# Patient Record
Sex: Female | Born: 2000 | Race: White | Hispanic: No | Marital: Single | Smoking: Never smoker
Health system: Southern US, Community
[De-identification: ages and names within clinical notes are randomized; demographics above are authoritative.]

## PROBLEM LIST (undated history)

## (undated) DIAGNOSIS — M48061 Spinal stenosis, lumbar region without neurogenic claudication: Secondary | ICD-10-CM

## (undated) DIAGNOSIS — F419 Anxiety disorder, unspecified: Secondary | ICD-10-CM

## (undated) HISTORY — PX: WISDOM TOOTH EXTRACTION: SHX21

## (undated) HISTORY — DX: Anxiety disorder, unspecified: F41.9

## (undated) HISTORY — DX: Spinal stenosis, lumbar region without neurogenic claudication: M48.061

## (undated) HISTORY — PX: TONSILLECTOMY: SUR1361

---
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
## 2018-06-22 ENCOUNTER — Ambulatory Visit (INDEPENDENT_AMBULATORY_CARE_PROVIDER_SITE_OTHER): Payer: 59 | Admitting: Family Medicine

## 2018-06-22 ENCOUNTER — Encounter: Payer: Self-pay | Admitting: Family Medicine

## 2018-06-22 VITALS — BP 118/80 | HR 65 | Temp 98.0°F | Ht 64.0 in | Wt 173.2 lb

## 2018-06-22 DIAGNOSIS — L739 Follicular disorder, unspecified: Secondary | ICD-10-CM

## 2018-06-22 DIAGNOSIS — F419 Anxiety disorder, unspecified: Secondary | ICD-10-CM | POA: Diagnosis not present

## 2018-06-22 DIAGNOSIS — Z0000 Encounter for general adult medical examination without abnormal findings: Secondary | ICD-10-CM | POA: Diagnosis not present

## 2018-06-22 MED ORDER — BUSPIRONE HCL 7.5 MG PO TABS: 7.5000 mg | ORAL_TABLET | Freq: Three times a day (TID) | ORAL | 1 refills | Status: DC

## 2018-06-22 MED ORDER — HYDROXYZINE HCL 10 MG PO TABS: ORAL_TABLET | ORAL | 1 refills | Status: DC

## 2018-06-22 MED ORDER — MUPIROCIN 2 % EX OINT: TOPICAL_OINTMENT | CUTANEOUS | 3 refills | Status: DC

## 2018-06-22 NOTE — Patient Instructions (Signed)
We are going to start you on hydroxyzine AS NEEDED for panic attacks/increased anxiety. You can take 1-2 pills as needed up to three times a day.   If this doesn't seem to work you can start the buspar. I sent in 7.5mg  and can take this THREE times/day. Can play with dose. Max dosage is 30mg /day.   Send me an email in one month.

## 2018-06-22 NOTE — Progress Notes (Signed)
Patient: Brittany Jacobs MRN: [id number] DOB: 2001-03-22 PCP: Orland MustardWolfe, Tyrina Hines, MD     Subjective:  Chief Complaint  Patient presents with  . Establish Care  . Anxiety  . rash on legs    HPI: The patient is a 17 y.o. female who presents today for establishing care. 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  She is going into her sophomore year. She has not been in the hospital over the last year and no change to medical history. She is not sexually active. Periods are normal. She is up to date on her shot record. Unsure about men b.  Rash on legs: She has had a rash on both of her legs that as been on and off for a while. She mainly has after she shaves. It does not itch her and just is more cosmetic. Just looks bad. She has mainly on her knees and sometimes in her inner legs. She does not get in her axillary area and sometimes gets in her groin. She uses a [employer] brand shaving cream and razor. She also uses a loofah. She has not put anything on it over the counter. It has stayed the same.   Anxiety: She states she has likely always had anxiety, but over the summer it has gotten progressively worse. Nothing happened this summer that triggered it. There is a family history of this. She has no triggers except thunderstorms. She does have panic attacks and is getting every other day. She gets chest pain and short of breath. They can last hours at a time. She is able to keep up with her ADLs and school. She pushes through it and just feels horrible. She has done counseling in the past and hated it. She has never been on medications. She isn't on regular exercise program and does not practice yoga.   Review of Systems  Constitutional: Negative for chills, fatigue and fever.  HENT: Negative for dental problem, ear pain, hearing loss and trouble swallowing.   Eyes: Negative for visual disturbance.  Respiratory: Negative for cough, chest tightness and shortness of breath.   Cardiovascular:  Negative for chest pain, palpitations and leg swelling.  Gastrointestinal: Negative for abdominal pain, blood in stool, diarrhea and nausea.  Endocrine: Negative for cold intolerance, polydipsia, polyphagia and polyuria.  Genitourinary: Negative for dysuria and hematuria.  Musculoskeletal: Negative for arthralgias.  Skin: Positive for rash.  Neurological: Negative for dizziness and headaches.  Psychiatric/Behavioral: Negative for dysphoric mood and sleep disturbance. The patient is nervous/anxious.     Allergies Patient has No Known Allergies.  Past Medical History Patient  has a past medical history of Anxiety and Lumbar spinal stenosis.  Surgical History Patient  has a past surgical history that includes Wisdom tooth extraction.  Family History Pateint's family history includes Alcohol abuse in her paternal grandfather; Cancer in her maternal grandmother; Heart attack in her maternal grandfather; Hyperlipidemia in her mother; Hypertension in her father, paternal grandfather, and paternal grandmother; Kidney disease in her maternal grandmother.  Social History Patient  reports that she has never smoked. She has never used smokeless tobacco. She reports that she does not drink alcohol or use drugs.    Objective: Vitals:   06/22/18 1002  BP: 118/80  Pulse: 65  Temp: 98 F (36.7 C)  TempSrc: Oral  SpO2: 99%  Weight: 173 lb 3.2 oz (78.6 kg)  Height: 5\' 4"  (1.626 m)    Body mass index is 29.73 kg/m.  Physical Exam  Constitutional: She is oriented to person, place, and time. She appears well-developed and well-nourished.  HENT:  Right Ear: External ear normal.  Left Ear: External ear normal.  Mouth/Throat: Oropharynx is clear and moist.  Eyes: Pupils are equal, round, and reactive to light. Conjunctivae and EOM are normal.  Neck: Normal range of motion. Neck supple. No thyromegaly present.  Cardiovascular: Normal rate, regular rhythm, normal heart sounds and intact distal  pulses.  No murmur heard. Pulmonary/Chest: Effort normal and breath sounds normal.  Abdominal: Soft. Bowel sounds are normal. She exhibits no distension. There is no tenderness.  Lymphadenopathy:    She has no cervical adenopathy.  Neurological: She is alert and oriented to person, place, and time. She displays normal reflexes. No cranial nerve deficit. Coordination normal.  Skin: Skin is warm and dry. Rash (erythematous papules over knees and just inferior to knees) noted.  Psychiatric: She has a normal mood and affect. Her behavior is normal.  No si/hi/ah/vh   Vitals reviewed.      GAD 7 : Generalized Anxiety Score 06/22/2018  Nervous, Anxious, on Edge 2  Control/stop worrying 3  Worry too much - different things 2  Trouble relaxing 3  Restless 1  Easily annoyed or irritable 2  Afraid - awful might happen 2  Total GAD 7 Score 15  Anxiety Difficulty Somewhat difficult      Office Visit from 06/22/2018 in [facility]  PHQ-9 Total Score  5      Assessment/plan: 1. Annual physical exam They are not going to do labs at this time since they are self pay for labs. Do want her thyroid checked yearly with her mom's history. Continue abstinence. No need for std screening. Exercise, sunscreen, good diet. Will look into men B vaccine.  Patient counseling [x]    Nutrition: Stressed importance of moderation in sodium/caffeine intake, saturated fat and cholesterol, caloric balance, sufficient intake of fresh fruits, vegetables, fiber, calcium, iron, and 1 mg of folate supplement per day (for females capable of pregnancy).  [x]    Stressed the importance of regular exercise.   [x]    Substance Abuse: Discussed cessation/primary prevention of tobacco, alcohol, or other drug use; driving or other dangerous activities under the influence; availability of treatment for abuse.   [x]    Injury prevention: Discussed safety belts, safety helmets, smoke detector, smoking near bedding  or upholstery.   [x]    Sexuality: Discussed sexually transmitted diseases, partner selection, use of condoms, avoidance of unintended pregnancy  and contraceptive alternatives.  [x]    Dental health: Discussed importance of regular tooth brushing, flossing, and dental visits.  [x]    Health maintenance and immunizations reviewed. Please refer to Health maintenance section.     2. Anxiety GAD7 score with mild-moderate anxiety. Affecting her daily. We are going to start her on an as needed medication for panic attacks/increased anxiety. Will do hydroxyzine prn. Drowsy precautions given. Since she leaves for school in a few days, also sending in buspar if needed. Discussed how to take this medication and side effects. Does not want to do counseling, so I really want her to start a regular exercise program and look into yoga. Very supportive family. She is going to email me in a month and f/u with me when she is home from school. Any issues, let me know.   3. Folliculitis Secondary to shaving. Recommended to get a different razor and try not to shave so close to skin. Would not use a loofah. Did send in  bactroban ointment to try out and discussed other options with limited research behind them.     Return in about 4 months (around 10/22/2018) for anxiety when home from school .   Orland MustardAllison Othel Dicostanzo, MD [facility] [facility]   06/22/2018 rah

## 2018-06-22 NOTE — Progress Notes (Deleted)
Patient: Brittany Jacobs MRN: [id number] DOB: 01/11/2001 PCP: Orland MustardWolfe, Allison, MD     Subjective:  No chief complaint on file.   HPI: The patient is a 17 y.o. female who presents today for annual exam. {He/she (caps):30048} denies any changes to past medical history. There have been no recent hospitalizations. They {Actions; are/are not:16769} following a well balanced diet and exercise plan. Weight has been {trend:16658}. No complaints today.    There is no immunization history on file for this patient. Colonoscopy: Mammogram:  Pap smear:  PSA:   Review of Systems  Allergies Patient has No Known Allergies.  Past Medical History Patient  has no past medical history on file.  Surgical History Patient  has no past surgical history on file.  Family History Pateint's family history is not on file.  Social History Patient  reports that she has never smoked. She does not have any smokeless tobacco history on file. She reports that she does not drink alcohol or use drugs.    Objective: There were no vitals filed for this visit.  There is no height or weight on file to calculate BMI.  Physical Exam     Assessment/plan:   No problem-specific Assessment & Plan notes found for this encounter.    No follow-ups on file.     Orland MustardAllison Wolfe, MD [place] [facility]  06/22/2018

## 2018-07-14 ENCOUNTER — Other Ambulatory Visit: Payer: Self-pay | Admitting: Family Medicine

## 2018-08-10 ENCOUNTER — Other Ambulatory Visit: Payer: Self-pay | Admitting: Family Medicine

## 2018-09-14 ENCOUNTER — Other Ambulatory Visit: Payer: Self-pay | Admitting: Family Medicine

## 2020-04-18 ENCOUNTER — Encounter: Payer: Self-pay | Admitting: Family Medicine

## 2020-04-18 ENCOUNTER — Ambulatory Visit (INDEPENDENT_AMBULATORY_CARE_PROVIDER_SITE_OTHER): Payer: 59 | Admitting: Family Medicine

## 2020-04-18 ENCOUNTER — Other Ambulatory Visit: Payer: Self-pay

## 2020-04-18 VITALS — BP 90/70 | HR 66 | Temp 97.2°F | Ht 64.0 in | Wt 144.0 lb

## 2020-04-18 DIAGNOSIS — F419 Anxiety disorder, unspecified: Secondary | ICD-10-CM | POA: Diagnosis not present

## 2020-04-18 DIAGNOSIS — F329 Major depressive disorder, single episode, unspecified: Secondary | ICD-10-CM

## 2020-04-18 MED ORDER — FLUOXETINE HCL 20 MG PO CAPS: 20.0000 mg | ORAL_CAPSULE | Freq: Every day | ORAL | 1 refills | Status: DC

## 2020-04-18 MED ORDER — HYDROXYZINE HCL 25 MG PO TABS: 25.0000 mg | ORAL_TABLET | Freq: Three times a day (TID) | ORAL | 0 refills | Status: DC | PRN

## 2020-04-18 NOTE — Patient Instructions (Signed)
1) starting prozac 20mg /day. If after 3 weeks you like but need more can increase to 40mg . Just take 2 pills.  2) hydroxzyine increased to 25mg . Take for panic attacks or if feeling like anxiety is spiraling out of control.  3) keep counseling 4) exercise.  5) yoga is also helpful.  6) dont watch the news.  7) seeing you a month.

## 2020-04-18 NOTE — Progress Notes (Signed)
Patient: Brittany Jacobs MRN: [id number] DOB: February 12, 2001 PCP: Orma Flaming, MD     Subjective:  Chief Complaint  Patient presents with  . Anxiety    HPI: The patient is a 19 y.o. female who presents today for Anxiety. She saw me in 2019 and we sent in hydroxyzine and buspar; however, the buspar was too expensive so she didn't pick this up. She has been taking hydrozyine prn and this is working well for her. She states her anxiety though is much worse and she knows she needs a daily medication. She did start counseling. She has knows she is very hard on herself and has a lot of self pressure. She was in online school the entire past year. She is an extravert and this likely also contributed. She is getting paralyzing panic attacks.   She has struggled with depression her freshman year of high school, but she did not go on medication. She may be slightly depressed. She feels like it's tied to her anxiety because she beats herself down because her anxiety has gotten so bad. She shames herself for having anxiety.   Review of Systems  Respiratory: Negative for cough, shortness of breath and wheezing.   Cardiovascular: Negative for chest pain and palpitations.  Gastrointestinal: Negative for abdominal pain, nausea and vomiting.  Neurological: Negative for dizziness, light-headedness and headaches.    Allergies Patient has No Known Allergies.  Past Medical History Patient  has a past medical history of Anxiety and Lumbar spinal stenosis.  Surgical History Patient  has a past surgical history that includes Wisdom tooth extraction.  Family History Pateint's family history includes Alcohol abuse in her paternal grandfather; Cancer in her maternal grandmother; Heart attack in her maternal grandfather; Hyperlipidemia in her mother; Hypertension in her father, paternal grandfather, and paternal grandmother; Kidney disease in her maternal grandmother.  Social History Patient  reports that she  has never smoked. She has never used smokeless tobacco. She reports that she does not drink alcohol and does not use drugs.    Objective: Vitals:   04/18/20 1406  BP: 90/70  Pulse: 66  Temp: (!) 97.2 F (36.2 C)  TempSrc: Temporal  SpO2: 97%  Weight: 144 lb (65.3 kg)  Height: 5\' 4"  (1.626 m)    Body mass index is 24.72 kg/m.  Physical Exam Vitals reviewed.  Constitutional:      Appearance: Normal appearance. She is normal weight.  Cardiovascular:     Rate and Rhythm: Normal rate and regular rhythm.     Heart sounds: Normal heart sounds.  Pulmonary:     Effort: Pulmonary effort is normal.     Breath sounds: Normal breath sounds.  Abdominal:     General: Abdomen is flat. Bowel sounds are normal.     Palpations: Abdomen is soft.  Neurological:     General: No focal deficit present.     Mental Status: She is alert and oriented to person, place, and time.  Psychiatric:        Mood and Affect: Mood normal.        Behavior: Behavior normal.     Comments: No si/hi/ah/vh         GAD 7 : Generalized Anxiety Score 04/18/2020 06/22/2018  Nervous, Anxious, on Edge 3 2  Control/stop worrying 3 3  Worry too much - different things 3 2  Trouble relaxing 1 3  Restless - 1  Easily annoyed or irritable 2 2  Afraid - awful might happen 3 2  Total  GAD 7 Score - 15  Anxiety Difficulty Very difficult Somewhat difficult     Office Visit from 04/18/2020 in [facility]  PHQ-9 Total Score 13       Assessment/plan: 1. Anxiety and depression GAD7 severe and phq9 is mild. She does need a daily medication. Starting her on prozac 20mg /day to help with both her severe anxiety and depression and hopefully help decrease her panic attacks. Continue with hydroxyzine prn and increasing to 25mg  prn TID. Continue counseling and exercise. I've explained to her that drugs of the SSRI class can have side effects such as weight gain, sexual dysfunction, insomnia, headache,  nausea. These medications are generally effective at alleviating symptoms of anxiety and/or depression. Let me know if significant side effects do occur. Any si/hi she is to call 911 or go to ER. Close f/u in 1 month.   This visit occurred during the SARS-CoV-2 public health emergency.  Safety protocols were in place, including screening questions prior to the visit, additional usage of staff PPE, and extensive cleaning of exam room while observing appropriate contact time as indicated for disinfecting solutions.     Return in about 1 month (around 05/18/2020) for anxiety and depression .   , MD [place]   04/18/2020

## 2020-05-19 ENCOUNTER — Ambulatory Visit: Payer: 59 | Admitting: Family Medicine

## 2020-06-11 ENCOUNTER — Other Ambulatory Visit: Payer: Self-pay

## 2020-06-11 ENCOUNTER — Ambulatory Visit (INDEPENDENT_AMBULATORY_CARE_PROVIDER_SITE_OTHER): Payer: 59 | Admitting: Family Medicine

## 2020-06-11 ENCOUNTER — Encounter: Payer: Self-pay | Admitting: Family Medicine

## 2020-06-11 VITALS — BP 113/77 | HR 71 | Temp 97.5°F | Ht 64.0 in | Wt 140.2 lb

## 2020-06-11 DIAGNOSIS — F329 Major depressive disorder, single episode, unspecified: Secondary | ICD-10-CM | POA: Diagnosis not present

## 2020-06-11 DIAGNOSIS — Z3009 Encounter for other general counseling and advice on contraception: Secondary | ICD-10-CM | POA: Diagnosis not present

## 2020-06-11 DIAGNOSIS — F419 Anxiety disorder, unspecified: Secondary | ICD-10-CM

## 2020-06-11 MED ORDER — FLUOXETINE HCL 20 MG PO CAPS: 20.0000 mg | ORAL_CAPSULE | Freq: Every day | ORAL | 1 refills | Status: DC

## 2020-06-11 MED ORDER — HYDROXYZINE HCL 25 MG PO TABS: 25.0000 mg | ORAL_TABLET | Freq: Three times a day (TID) | ORAL | 1 refills | Status: AC | PRN

## 2020-06-11 NOTE — Patient Instructions (Signed)
-  doing great! Refilled prozac for 90 days with refill and also your hydroxyzine.   -good luck with school! Let me know if you need anything,   Dr. Artis Flock

## 2020-06-11 NOTE — Progress Notes (Signed)
Patient: Brittany Jacobs MRN: [id number] DOB: 24-Oct-2001 PCP: Orland Mustard, MD     Subjective:  Chief Complaint  Patient presents with  . Anxiety  . Depression    HPI: The patient is a 19 y.o. female who presents today for Anxiety/Depression. She complains of what she describes as "funky" dreams due to taking Prozac. She says that they have subsided, but did have one last week. She states the dreams were violent. After the first week they have pretty much subsided. She is doing well and symptoms of her anxiety and depression have drastically improved. Only side effect is waking up at 2am. She goes to bed at midnight and is not practicing great sleep hygiene. She has only had to take hydroxyzine a few times. No panic attacks. Has a new boyfriend.   She also would like to discuss Depo Provera birth control. She just would like to discuss this.   Review of Systems  Cardiovascular: Negative for chest pain and palpitations.  Gastrointestinal: Negative for abdominal pain, nausea and vomiting.  Genitourinary: Negative for frequency, pelvic pain and urgency.  Neurological: Negative for dizziness and headaches.    Allergies Patient has No Known Allergies.  Past Medical History Patient  has a past medical history of Anxiety and Lumbar spinal stenosis.  Surgical History Patient  has a past surgical history that includes Wisdom tooth extraction.  Family History Pateint's family history includes Alcohol abuse in her paternal grandfather; Cancer in her maternal grandmother; Heart attack in her maternal grandfather; Hyperlipidemia in her mother; Hypertension in her father, paternal grandfather, and paternal grandmother; Kidney disease in her maternal grandmother.  Social History Patient  reports that she has never smoked. She has never used smokeless tobacco. She reports that she does not drink alcohol and does not use drugs.    Objective: Vitals:   06/11/20 1430  BP: 113/77  Pulse: 71   Temp: (!) 97.5 F (36.4 C)  TempSrc: Temporal  SpO2: 100%  Weight: 140 lb 3.2 oz (63.6 kg)  Height: 5\' 4"  (1.626 m)    Body mass index is 24.07 kg/m.  Physical Exam Vitals reviewed.  Constitutional:      Appearance: Normal appearance. She is well-developed and normal weight.  HENT:     Head: Normocephalic and atraumatic.  Neck:     Thyroid: No thyromegaly.  Cardiovascular:     Rate and Rhythm: Normal rate and regular rhythm.     Heart sounds: Normal heart sounds. No murmur heard.   Pulmonary:     Effort: Pulmonary effort is normal.     Breath sounds: Normal breath sounds.  Abdominal:     General: Bowel sounds are normal. There is no distension.     Palpations: Abdomen is soft.     Tenderness: There is no abdominal tenderness.  Musculoskeletal:     Cervical back: Normal range of motion and neck supple.  Lymphadenopathy:     Cervical: No cervical adenopathy.  Skin:    General: Skin is warm and dry.     Capillary Refill: Capillary refill takes less than 2 seconds.     Findings: No rash.  Neurological:     General: No focal deficit present.     Mental Status: She is alert and oriented to person, place, and time.     Cranial Nerves: No cranial nerve deficit.     Coordination: Coordination normal.     Deep Tendon Reflexes: Reflexes normal.  Psychiatric:        Mood and  Affect: Mood normal.        Behavior: Behavior normal.        GAD 7 : Generalized Anxiety Score 06/11/2020 04/18/2020 06/22/2018  Nervous, Anxious, on Edge 0 3 2  Control/stop worrying 0 3 3  Worry too much - different things 1 3 2   Trouble relaxing 1 1 3   Restless 0 - 1  Easily annoyed or irritable 0 2 2  Afraid - awful might happen 0 3 2  Total GAD 7 Score 2 - 15  Anxiety Difficulty Not difficult at all Very difficult Somewhat difficult      Office Visit from 06/11/2020 in [facility]  PHQ-9 Total Score 3      Assessment/plan: 1. Anxiety and depression gad7 and phq9  scores significantly improved and she is doing so well on them. symptoms controlled and no longer having panic attacks. Continue current prozac/hydrozyzine prn. Discussed sleep hygiene and taking melatonin. F/u in 6-12 months. (will be at school so may not be able to come in).   2. Birth control counseling Discussed depo vs. iud vs. Ocp. I do think her acne would get worse on the depo and talked about weight gain. She will continue with ocp. Recommended continued safe sex regardless.     This visit occurred during the SARS-CoV-2 public health emergency.  Safety protocols were in place, including screening questions prior to the visit, additional usage of staff PPE, and extensive cleaning of exam room while observing appropriate contact time as indicated for disinfecting solutions.    No follow-ups on file.    [facility], MD [facility]   06/11/2020

## 2020-07-23 ENCOUNTER — Encounter: Payer: Self-pay | Admitting: Family Medicine

## 2020-07-24 MED ORDER — FLUOXETINE HCL 40 MG PO CAPS: 40.0000 mg | ORAL_CAPSULE | Freq: Every day | ORAL | 1 refills | Status: DC

## 2020-07-27 ENCOUNTER — Encounter: Payer: Self-pay | Admitting: Family Medicine

## 2020-10-24 ENCOUNTER — Encounter: Payer: Self-pay | Admitting: Family Medicine

## 2021-03-26 ENCOUNTER — Other Ambulatory Visit: Payer: Self-pay | Admitting: Family Medicine

## 2021-08-06 ENCOUNTER — Other Ambulatory Visit: Payer: Self-pay | Admitting: Family Medicine

## 2021-11-13 ENCOUNTER — Other Ambulatory Visit: Payer: Self-pay | Admitting: Family Medicine

## 2021-11-18 ENCOUNTER — Other Ambulatory Visit: Payer: Self-pay | Admitting: Family Medicine

## 2021-12-29 DIAGNOSIS — F32A Depression, unspecified: Secondary | ICD-10-CM | POA: Insufficient documentation

## 2021-12-29 DIAGNOSIS — Z8342 Family history of familial hypercholesterolemia: Secondary | ICD-10-CM | POA: Insufficient documentation

## 2021-12-29 DIAGNOSIS — F419 Anxiety disorder, unspecified: Secondary | ICD-10-CM | POA: Insufficient documentation

## 2022-02-02 DIAGNOSIS — L720 Epidermal cyst: Secondary | ICD-10-CM | POA: Insufficient documentation

## 2022-09-21 ENCOUNTER — Ambulatory Visit (INDEPENDENT_AMBULATORY_CARE_PROVIDER_SITE_OTHER): Payer: No Typology Code available for payment source | Admitting: Medical

## 2022-09-21 ENCOUNTER — Encounter (HOSPITAL_BASED_OUTPATIENT_CLINIC_OR_DEPARTMENT_OTHER): Payer: Self-pay | Admitting: Medical

## 2022-09-21 VITALS — BP 118/79 | HR 64 | Ht 64.0 in | Wt 197.0 lb

## 2022-09-21 DIAGNOSIS — L739 Follicular disorder, unspecified: Secondary | ICD-10-CM

## 2022-09-21 DIAGNOSIS — R102 Pelvic and perineal pain: Secondary | ICD-10-CM

## 2022-09-21 DIAGNOSIS — Z7689 Persons encountering health services in other specified circumstances: Secondary | ICD-10-CM

## 2022-09-21 NOTE — Progress Notes (Signed)
   History:  Ms. Brittany Jacobs is a 21 y.o. who presents to clinic today for labial lesion. Patient states that this has been recurrent for many months, since prior to her most recent annual exam in [place]. Pap smear 01/2022 was normal. She was put on antibiotics for possible folliculitis and it resolved, but has recurrent on multiple occasions. It is not currently inflamed or painful. She denies fever. She is not currently sexually active.    The following portions of the patient's history were reviewed and updated as appropriate: allergies, current medications, family history, past medical history, social history, past surgical history and problem list.  Review of Systems:  Review of Systems  Constitutional:  Negative for fever and malaise/fatigue.  Gastrointestinal:  Negative for abdominal pain, constipation, diarrhea, nausea and vomiting.  Genitourinary:  Negative for dysuria, frequency and urgency.       Neg - vaginal bleeding, discharge, pelvic pain     Objective:  Physical Exam BP 118/79 (BP Location: Left Arm, Patient Position: Sitting, Cuff Size: Large)   Pulse 64   Ht 5\' 4"  (1.626 m) Comment: Reported  Wt 197 lb (89.4 kg)   LMP 09/12/2022 (Approximate)   BMI 33.81 kg/m  Physical Exam Vitals and nursing note reviewed. Exam conducted with a chaperone present.  Constitutional:      General: She is not in acute distress.    Appearance: Normal appearance. She is well-developed and normal weight.  HENT:     Head: Normocephalic and atraumatic.  Neck:     Thyroid: No thyromegaly.  Cardiovascular:     Rate and Rhythm: Normal rate and regular rhythm.  Pulmonary:     Effort: Pulmonary effort is normal.  Abdominal:     General: Abdomen is flat.     Palpations: Abdomen is soft. There is no mass.  Musculoskeletal:     Cervical back: Neck supple.  Skin:    General: Skin is warm and dry.     Findings: No erythema.       Neurological:     Mental Status: She is alert and  oriented to person, place, and time.  Psychiatric:        Mood and Affect: Mood normal.     Health Maintenance Due  Topic Date Due   COVID-19 Vaccine (1) Never done   HPV VACCINES (1 - 2-dose series) Never done   HIV Screening  Never done   Hepatitis C Screening  Never done   PAP-Cervical Cytology Screening  Never done   PAP SMEAR-Modifier  Never done   INFLUENZA VACCINE  Never done    Labs, imaging and previous visits in Epic and Care Everywhere reviewed  Assessment & Plan:  1. Encounter to establish care - Ambulatory referral to [facility]  2. Vaginal pain  3. Folliculitis - Ambulatory referral to Dermatology  Approximately 15 minutes of total time was spent with this patient on history taking, physical exam, coordination of care, patient education and documentation.   [facility], PA-C 09/21/2022 12:21 PM

## 2023-04-08 ENCOUNTER — Ambulatory Visit: Payer: No Typology Code available for payment source | Admitting: Cardiology

## 2023-04-19 NOTE — Progress Notes (Signed)
Cardiology Office Note:    Date:  04/20/2023   ID:  Brittany Jacobs, DOB 2001/10/11, MRN [id number]  PCP:  Aviva Kluver   [facility] Providers Cardiologist:  Maisie Fus, MD     Referring MD: Shawnie Dapper, PA-C   No chief complaint on file. palpitations  History of Present Illness:    Brittany Jacobs is a 22 y.o. female with a hx of anxiety referral from her PCP at [facility]. She notes high heart rates on her smart watch, HR can go to 180-190. She notes when resting stays around 120.  This is daily. Cut back caffeine.   Past Medical History:  Diagnosis Date   Anxiety    Lumbar spinal stenosis     Past Surgical History:  Procedure Laterality Date   WISDOM TOOTH EXTRACTION      Current Medications: Current Meds  Medication Sig   hydrOXYzine (ATARAX/VISTARIL) 25 MG tablet Take 1 tablet (25 mg total) by mouth 3 (three) times daily as needed.   metoprolol tartrate (LOPRESSOR) 25 MG tablet Take 25 mg by mouth 2 (two) times daily.   SYEDA 3-0.03 MG tablet Take 1 tablet by mouth daily.     Allergies:   Minocycline and Doxycycline   Social History   Socioeconomic History   Marital status: Single    Spouse name: Not on file   Number of children: Not on file   Years of education: Not on file   Highest education level: Not on file  Occupational History   Not on file  Tobacco Use   Smoking status: Never   Smokeless tobacco: Never  Vaping Use   Vaping Use: Former  Substance and Sexual Activity   Alcohol use: No   Drug use: No   Sexual activity: Not on file  Other Topics Concern   Not on file  Social History Narrative   Not on file   Social Determinants of Health   Financial Resource Strain: Not on file  Food Insecurity: Not on file  Transportation Needs: Not on file  Physical Activity: Not on file  Stress: Not on file  Social Connections: Not on file     Family History: The patient's family history includes Alcohol abuse in her paternal  grandfather; Cancer in her maternal grandmother; Heart attack in her maternal grandfather; Hyperlipidemia in her mother; Hypertension in her father, paternal grandfather, and paternal grandmother; Kidney disease in her maternal grandmother.  ROS:   Please see the history of present illness.     All other systems reviewed and are negative.  EKGs/Labs/Other Studies Reviewed:    The following studies were reviewed today:   EKG:  EKG is  ordered today.  The ekg ordered today demonstrates   04/20/2023- NSR  Recent Labs: No results found for requested labs within last 365 days.   Recent Lipid Panel No results found for: "CHOL", "TRIG", "HDL", "CHOLHDL", "VLDL", "LDLCALC", "LDLDIRECT"   Risk Assessment/Calculations:     Physical Exam:    VS:   Vitals:   04/20/23 0803  BP: 102/80  Pulse: 71  SpO2: 99%     Wt Readings from Last 3 Encounters:  04/20/23 186 lb 12.8 oz (84.7 kg)  09/21/22 197 lb (89.4 kg)  06/11/20 140 lb 3.2 oz (63.6 kg)     GEN:  Well nourished, well developed in no acute distress HEENT: Normal NECK: No JVD; No carotid bruits LYMPHATICS: No lymphadenopathy CARDIAC: RRR, no murmurs, rubs, gallops RESPIRATORY:  Clear to auscultation without  rales, wheezing or rhonchi  ABDOMEN: Soft, non-tender, non-distended MUSCULOSKELETAL:  No edema; No deformity  SKIN: Warm and dry NEUROLOGIC:  Alert and oriented x 3 PSYCHIATRIC:  Normal affect   ASSESSMENT:   Tachycardia: no red flags. will get a cardiac monitor PLAN:    In order of problems listed above:  3 day ziopatch Follow up PRN  Medication Adjustments/Labs and Tests Ordered: Current medicines are reviewed at length with the patient today.  Concerns regarding medicines are outlined above.  Orders Placed This Encounter  Procedures   LONG TERM MONITOR (3-14 DAYS)   EKG 12-Lead   No orders of the defined types were placed in this encounter.   Patient Instructions  Medication Instructions:   None      Lab Work:  none   Testing/Procedures:  Web designer  Will be mailed to your home 3 to 7 days Your physician has recommended that you wear a holter monitor 3 day Zio . Holter monitors are medical devices that record the heart's electrical activity. Doctors most often use these monitors to diagnose arrhythmias. Arrhythmias are problems with the speed or rhythm of the heartbeat. The monitor is a small, portable device. You can wear one while you do your normal daily activities. This is usually used to diagnose what is causing palpitations/syncope (passing out).   Follow-Up: At [facility], you and your health needs are our priority.  As part of our continuing mission to provide you with exceptional heart care, we have created designated Provider Care Teams.  These Care Teams include your primary Cardiologist (physician) and Advanced Practice Providers (APPs -  Physician Assistants and Nurse Practitioners) who all work together to provide you with the care you need, when you need it.     Your next appointment:   As needed  if test is normal   The format for your next appointment:   In Person  Provider:   Maisie Fus, MD    Other Instructions  ZIO XT- Long Term Monitor Instructions  Your physician has requested you wear a ZIO patch monitor for 14 days.  This is a single patch monitor. [employer] supplies one patch monitor per enrollment. Additional stickers are not available. Please do not apply patch if you will be having a Nuclear Stress Test,  Echocardiogram, Cardiac CT, MRI, or Chest Xray during the period you would be wearing the  monitor. The patch cannot be worn during these tests. You cannot remove and re-apply the  ZIO XT patch monitor.  Your ZIO patch monitor will be mailed 3 day USPS to your address on file. It may take 3-5 days  to receive your monitor after you have been enrolled.  Once you have received your monitor, please review the enclosed instructions.  Your monitor  has already been registered assigning a specific monitor serial # to you.  Billing and Patient Assistance Program Information  We have supplied [employer] with any of your insurance information on file for billing purposes. [employer] offers a sliding scale Patient Assistance Program for patients that do not have  insurance, or whose insurance does not completely cover the cost of the ZIO monitor.  You must apply for the Patient Assistance Program to qualify for this discounted rate.  To apply, please call [employer] at [phone], select option 4, select option 2, ask to apply for  Patient Assistance Program. Meredeth Ide will ask your household income, and how many people  are in your household. They will quote your out-of-pocket cost  based on that information.  [employer] will also be able to set up a 77-month, interest-free payment plan if needed.  Applying the monitor   Shave hair from upper left chest.  Hold abrader disc by orange tab. Rub abrader in 40 strokes over the upper left chest as  indicated in your monitor instructions.  Clean area with 4 enclosed alcohol pads. Let dry.  Apply patch as indicated in monitor instructions. Patch will be placed under collarbone on left  side of chest with arrow pointing upward.  Rub patch adhesive wings for 2 minutes. Remove white label marked "1". Remove the white  label marked "2". Rub patch adhesive wings for 2 additional minutes.  While looking in a mirror, press and release button in center of patch. A small green light will  flash 3-4 times. This will be your only indicator that the monitor has been turned on.  Do not shower for the first 24 hours. You may shower after the first 24 hours.  Press the button if you feel a symptom. You will hear a small click. Record Date, Time and  Symptom in the Patient Logbook.  When you are ready to remove the patch, follow instructions on the last 2 pages of Patient  Logbook. Stick patch monitor onto  the last page of Patient Logbook.  Place Patient Logbook in the blue and white box. Use locking tab on box and tape box closed  securely. The blue and white box has prepaid postage on it. Please place it in the mailbox as  soon as possible. Your physician should have your test results approximately 7 days after the  monitor has been mailed back to [facility].  Call [facility] Customer Care at [phone] if you have questions regarding  your ZIO XT patch monitor. Call them immediately if you see an orange light blinking on your  monitor.  If your monitor falls off in less than 4 days, contact our Monitor department at [phone].  If your monitor becomes loose or falls off after 4 days call [employer] at [phone] for  suggestions on securing your monitor    Signed, Maisie Fus, MD  04/20/2023 9:46 AM    [facility]

## 2023-04-20 ENCOUNTER — Ambulatory Visit: Payer: No Typology Code available for payment source | Attending: Cardiology | Admitting: Internal Medicine

## 2023-04-20 ENCOUNTER — Encounter: Payer: Self-pay | Admitting: Internal Medicine

## 2023-04-20 ENCOUNTER — Ambulatory Visit: Payer: No Typology Code available for payment source | Attending: Internal Medicine

## 2023-04-20 VITALS — BP 102/80 | HR 71 | Ht 64.0 in | Wt 186.8 lb

## 2023-04-20 DIAGNOSIS — R002 Palpitations: Secondary | ICD-10-CM

## 2023-04-20 NOTE — Progress Notes (Unsigned)
Enrolled patient for a 3 day Zio XT monitor to be mailed to patients home  

## 2023-04-20 NOTE — Patient Instructions (Signed)
Medication Instructions:   None     Lab Work:  none   Testing/Procedures:  Web designer  Will be mailed to your home 3 to 7 days Your physician has recommended that you wear a holter monitor 3 day Zio . Holter monitors are medical devices that record the heart's electrical activity. Doctors most often use these monitors to diagnose arrhythmias. Arrhythmias are problems with the speed or rhythm of the heartbeat. The monitor is a small, portable device. You can wear one while you do your normal daily activities. This is usually used to diagnose what is causing palpitations/syncope (passing out).   Follow-Up: At [facility], you and your health needs are our priority.  As part of our continuing mission to provide you with exceptional heart care, we have created designated Provider Care Teams.  These Care Teams include your primary Cardiologist (physician) and Advanced Practice Providers (APPs -  Physician Assistants and Nurse Practitioners) who all work together to provide you with the care you need, when you need it.     Your next appointment:   As needed  if test is normal   The format for your next appointment:   In Person  Provider:   Maisie Fus, MD    Other Instructions  ZIO XT- Long Term Monitor Instructions  Your physician has requested you wear a ZIO patch monitor for 14 days.  This is a single patch monitor. [employer] supplies one patch monitor per enrollment. Additional stickers are not available. Please do not apply patch if you will be having a Nuclear Stress Test,  Echocardiogram, Cardiac CT, MRI, or Chest Xray during the period you would be wearing the  monitor. The patch cannot be worn during these tests. You cannot remove and re-apply the  ZIO XT patch monitor.  Your ZIO patch monitor will be mailed 3 day USPS to your address on file. It may take 3-5 days  to receive your monitor after you have been enrolled.  Once you have received your monitor, please  review the enclosed instructions. Your monitor  has already been registered assigning a specific monitor serial # to you.  Billing and Patient Assistance Program Information  We have supplied [employer] with any of your insurance information on file for billing purposes. [employer] offers a sliding scale Patient Assistance Program for patients that do not have  insurance, or whose insurance does not completely cover the cost of the ZIO monitor.  You must apply for the Patient Assistance Program to qualify for this discounted rate.  To apply, please call [employer] at [phone], select option 4, select option 2, ask to apply for  Patient Assistance Program. Meredeth Ide will ask your household income, and how many people  are in your household. They will quote your out-of-pocket cost based on that information.  [employer] will also be able to set up a 47-month, interest-free payment plan if needed.  Applying the monitor   Shave hair from upper left chest.  Hold abrader disc by orange tab. Rub abrader in 40 strokes over the upper left chest as  indicated in your monitor instructions.  Clean area with 4 enclosed alcohol pads. Let dry.  Apply patch as indicated in monitor instructions. Patch will be placed under collarbone on left  side of chest with arrow pointing upward.  Rub patch adhesive wings for 2 minutes. Remove white label marked "1". Remove the white  label marked "2". Rub patch adhesive wings for 2 additional minutes.  While looking in  a mirror, press and release button in center of patch. A small green light will  flash 3-4 times. This will be your only indicator that the monitor has been turned on.  Do not shower for the first 24 hours. You may shower after the first 24 hours.  Press the button if you feel a symptom. You will hear a small click. Record Date, Time and  Symptom in the Patient Logbook.  When you are ready to remove the patch, follow instructions on the last 2 pages of Patient   Logbook. Stick patch monitor onto the last page of Patient Logbook.  Place Patient Logbook in the blue and white box. Use locking tab on box and tape box closed  securely. The blue and white box has prepaid postage on it. Please place it in the mailbox as  soon as possible. Your physician should have your test results approximately 7 days after the  monitor has been mailed back to [facility].  Call [facility] Customer Care at [phone] if you have questions regarding  your ZIO XT patch monitor. Call them immediately if you see an orange light blinking on your  monitor.  If your monitor falls off in less than 4 days, contact our Monitor department at [phone].  If your monitor becomes loose or falls off after 4 days call [employer] at [phone] for  suggestions on securing your monitor

## 2023-04-24 DIAGNOSIS — R002 Palpitations: Secondary | ICD-10-CM | POA: Diagnosis not present

## 2023-05-20 ENCOUNTER — Encounter: Payer: Self-pay | Admitting: Internal Medicine

## 2023-05-23 ENCOUNTER — Other Ambulatory Visit: Payer: Self-pay | Admitting: Internal Medicine

## 2023-05-23 DIAGNOSIS — R002 Palpitations: Secondary | ICD-10-CM

## 2023-05-23 MED ORDER — PROPRANOLOL HCL 20 MG PO TABS
20.0000 mg | ORAL_TABLET | Freq: Two times a day (BID) | ORAL | 1 refills | Status: DC | PRN
Start: 2023-05-23 — End: 2023-07-18

## 2023-06-14 ENCOUNTER — Other Ambulatory Visit: Payer: Self-pay | Admitting: Internal Medicine

## 2023-06-14 DIAGNOSIS — R002 Palpitations: Secondary | ICD-10-CM

## 2023-07-16 ENCOUNTER — Other Ambulatory Visit: Payer: Self-pay | Admitting: Internal Medicine

## 2023-07-16 DIAGNOSIS — R002 Palpitations: Secondary | ICD-10-CM

## 2023-09-20 ENCOUNTER — Other Ambulatory Visit: Payer: Self-pay | Admitting: Internal Medicine

## 2023-09-20 DIAGNOSIS — R002 Palpitations: Secondary | ICD-10-CM

## 2024-05-07 ENCOUNTER — Ambulatory Visit (INDEPENDENT_AMBULATORY_CARE_PROVIDER_SITE_OTHER): Admitting: Certified Nurse Midwife

## 2024-05-07 ENCOUNTER — Encounter (HOSPITAL_BASED_OUTPATIENT_CLINIC_OR_DEPARTMENT_OTHER): Payer: Self-pay | Admitting: Certified Nurse Midwife

## 2024-05-07 ENCOUNTER — Other Ambulatory Visit (HOSPITAL_COMMUNITY)
Admission: RE | Admit: 2024-05-07 | Discharge: 2024-05-07 | Disposition: A | Discharge status: Home / Self Care | Source: Ambulatory Visit | Attending: Certified Nurse Midwife | Admitting: Certified Nurse Midwife

## 2024-05-07 VITALS — BP 128/88 | HR 66 | Ht 64.0 in | Wt 192.2 lb

## 2024-05-07 DIAGNOSIS — Z30430 Encounter for insertion of intrauterine contraceptive device: Secondary | ICD-10-CM | POA: Diagnosis not present

## 2024-05-07 DIAGNOSIS — Z113 Encounter for screening for infections with a predominantly sexual mode of transmission: Secondary | ICD-10-CM

## 2024-05-07 DIAGNOSIS — Z124 Encounter for screening for malignant neoplasm of cervix: Secondary | ICD-10-CM

## 2024-05-07 MED ORDER — LEVONORGESTREL 20 MCG/DAY IU IUD
1.0000 | INTRAUTERINE_SYSTEM | Freq: Once | INTRAUTERINE | Status: AC
  Administered 2024-05-07: 1 via INTRAUTERINE

## 2024-05-07 NOTE — Progress Notes (Unsigned)
 Error

## 2024-05-07 NOTE — Progress Notes (Unsigned)
 23 y.o. G0P0000 Single White or Caucasian female here for insertion of Mirena IUD and pap smear. Pt verbalizes understanding of risks of Mirena IUD (uterine perforation, expulsion, irregular spotting and/or bleeding). She reports her periods are monthly but very painful. She is not sexually active at this time. Has a good support system.   Patient's last menstrual period was 04/22/2024.          Sexually active: No.  The current method of family planning is abstinence Exercising: Yes.     Smoker:  no  Health Maintenance: Pap:  Pap due History of abnormal Pap:  no  reports that she has never smoked. She has never used smokeless tobacco. She reports that she does not drink alcohol and does not use drugs.  Past Medical History:  Diagnosis Date   Anxiety    Lumbar spinal stenosis     Past Surgical History:  Procedure Laterality Date   TONSILLECTOMY Bilateral    WISDOM TOOTH EXTRACTION      Current Outpatient Medications  Medication Sig Dispense Refill   busPIRone  (BUSPAR ) 30 MG tablet Take 30 mg by mouth daily.     celecoxib (CELEBREX) 100 MG capsule Take 100 mg by mouth 2 (two) times daily as needed.     cyclobenzaprine (FLEXERIL) 5 MG tablet Take 5 mg by mouth 3 (three) times daily as needed for muscle spasms.     hydrOXYzine  (ATARAX ) 25 MG tablet Take 25 mg by mouth 3 (three) times daily as needed.     hydrOXYzine  (ATARAX /VISTARIL ) 25 MG tablet Take 1 tablet (25 mg total) by mouth 3 (three) times daily as needed. 90 tablet 1   prazosin (MINIPRESS) 1 MG capsule Take 1 mg by mouth at bedtime.     FLUoxetine  (PROZAC ) 40 MG capsule TAKE 1 CAPSULE BY MOUTH EVERY DAY (Patient not taking: Reported on 05/07/2024) 90 capsule 0   loratadine (CLARITIN) 10 MG tablet Take 10 mg by mouth daily as needed for allergies.  (Patient not taking: Reported on 05/07/2024)     ondansetron (ZOFRAN-ODT) 4 MG disintegrating tablet Take 4 mg by mouth 3 (three) times daily as needed. (Patient not taking: Reported  on 05/07/2024)     propranolol  (INDERAL ) 20 MG tablet Take 1 tablet (20 mg total) by mouth 2 (two) times daily as needed (tachycardia). (Patient not taking: Reported on 05/07/2024) 60 tablet 5   SYEDA 3-0.03 MG tablet Take 1 tablet by mouth daily. (Patient not taking: Reported on 05/07/2024)     No current facility-administered medications for this visit.    Family History  Problem Relation Age of Onset   Hyperlipidemia Mother    Hypertension Father    Cancer Maternal Grandmother    Kidney disease Maternal Grandmother    Heart attack Maternal Grandfather    Hypertension Paternal Grandmother    Hypertension Paternal Grandfather    Alcohol abuse Paternal Grandfather     ROS: Constitutional: negative Genitourinary:negative  Exam:   BP 128/88   Pulse 66   Ht 5' 4 (1.626 m) Comment: Reported  Wt 192 lb 3.2 oz (87.2 kg)   LMP 04/22/2024   BMI 32.99 kg/m   Height: 5' 4 (162.6 cm) (Reported)  General appearance: alert, cooperative and appears stated age Abdomen: soft, non-tender; bowel sounds normal; no masses,  no organomegaly Extremities: extremities normal, atraumatic, no cyanosis or edema Skin: Skin color, texture, turgor normal. No rashes or lesions Lymph nodes: Cervical, supraclavicular, and axillary nodes normal. No abnormal inguinal nodes palpated Neurologic: Grossly  normal   Pelvic: External genitalia:  no lesions              Urethra:  normal appearing urethra with no masses, tenderness or lesions              Bartholins and Skenes: normal                 Vagina: normal appearing vagina with normal color and no discharge, no lesions              Cervix: no bleeding following Pap, no cervical motion tenderness, no lesions, and nulliparous appearance              Pap taken: Yes.   Bimanual Exam:  Uterus:  normal size, contour, position, consistency, mobility, non-tender              Adnexa: no mass, fullness, tenderness               Rectovaginal: Confirms                Anus:  normal sphincter tone, no lesions  Chaperone,  CMA, was present for exam.  Assessment/Plan:  1. Encounter for insertion of Mirena IUD - levonorgestrel (MIRENA) 20 MCG/DAY IUD 1 each  2. Cervical cancer screening (Primary) - Cytology - PAP( [facility])  3. Routine screening for STI (sexually transmitted infection) - Cytology - PAP( [facility])     GYNECOLOGY OFFICE PROCEDURE NOTE  Brittany Jacobs is a 23 y.o. G0P0000 here for Mirena IUD Insertion.  IUD Insertion Procedure Note Patient identified, informed consent performed, consent signed.   Discussed risks of irregular bleeding, cramping, infection, malpositioning or misplacement of the IUD outside the uterus which may require further procedure such as laparoscopy. Time out was performed.  Urine pregnancy test negative.  Speculum placed in the vagina.  Cervix visualized.  Cleaned with Betadine x 2.  Grasped anteriorly with a single tooth tenaculum.  Uterus sounded to 5 cm.  IUD placed per manufacturer's recommendations.  Strings trimmed to 3 cm. Tenaculum was removed, good hemostasis noted.  Patient tolerated procedure well.   Patient was given post-procedure instructions.  She was advised to have backup contraception for one week.  Patient was also asked to check IUD strings periodically and follow up in 4 weeks for IUD check.   Brittany Jacobs, CNM 9:02 AM

## 2024-05-08 DIAGNOSIS — Z113 Encounter for screening for infections with a predominantly sexual mode of transmission: Secondary | ICD-10-CM | POA: Insufficient documentation

## 2024-05-08 DIAGNOSIS — Z30430 Encounter for insertion of intrauterine contraceptive device: Secondary | ICD-10-CM | POA: Insufficient documentation

## 2024-05-08 DIAGNOSIS — Z124 Encounter for screening for malignant neoplasm of cervix: Secondary | ICD-10-CM | POA: Insufficient documentation

## 2024-05-09 LAB — CYTOLOGY - PAP
Chlamydia: NEGATIVE
Comment: NEGATIVE
Comment: NEGATIVE
Comment: NEGATIVE
Comment: NORMAL
Diagnosis: NEGATIVE
High risk HPV: NEGATIVE
Neisseria Gonorrhea: NEGATIVE
Trichomonas: NEGATIVE

## 2024-05-10 ENCOUNTER — Ambulatory Visit (HOSPITAL_BASED_OUTPATIENT_CLINIC_OR_DEPARTMENT_OTHER): Payer: Self-pay | Admitting: Certified Nurse Midwife

## 2024-05-14 ENCOUNTER — Encounter (HOSPITAL_BASED_OUTPATIENT_CLINIC_OR_DEPARTMENT_OTHER): Payer: Self-pay | Admitting: Certified Nurse Midwife

## 2024-06-05 ENCOUNTER — Ambulatory Visit: Admitting: Physical Medicine and Rehabilitation

## 2024-06-08 ENCOUNTER — Encounter: Payer: Self-pay | Admitting: Physical Medicine and Rehabilitation

## 2024-06-08 ENCOUNTER — Ambulatory Visit: Admitting: Physical Medicine and Rehabilitation

## 2024-06-08 DIAGNOSIS — M5441 Lumbago with sciatica, right side: Secondary | ICD-10-CM

## 2024-06-08 DIAGNOSIS — M7918 Myalgia, other site: Secondary | ICD-10-CM | POA: Diagnosis not present

## 2024-06-08 DIAGNOSIS — M5442 Lumbago with sciatica, left side: Secondary | ICD-10-CM | POA: Diagnosis not present

## 2024-06-08 DIAGNOSIS — M5416 Radiculopathy, lumbar region: Secondary | ICD-10-CM | POA: Diagnosis not present

## 2024-06-08 DIAGNOSIS — G8929 Other chronic pain: Secondary | ICD-10-CM

## 2024-06-08 NOTE — Progress Notes (Signed)
 Brittany Jacobs - 23 y.o. female MRN [id number]  Date of birth: 21-Feb-2001  Office Visit Note: Visit Date: 06/08/2024 PCP: Pcp, No Referred by: No ref. provider found  Subjective: Chief Complaint  Patient presents with   Lower Back - Pain   HPI: Brittany Jacobs is a 23 y.o. female who comes in today per the request of Brittany Schimke, FNP for evaluation of chronic, worsening and severe bilateral lower back pain radiating to buttocks, right greater than left. Pain ongoing for several years, worsened in June after long car ride. Her pain is intermittent, feels her posture is contributing to some of her pain. She describes pain as dull and aching sensation, currently rates as 5 out of 10. Some relief of pain with home exercise regimen, rest and use of medications. She does take Ibuprofen and Flexeril as needed. She is currently undergoing physical therapy/dry needling treatments, some relief of pain with these therapies. She also attends chiropractic treatments once a week. Recent lumbar radiographs show no acute compression deformity or traumatic malalignment. Disc spaces are maintained. No prior lumbar MRI imaging. No history of lumbar surgery/injections. States she was diagnosed with lumbar spinal canal stenosis at the age of 74. Patient denies focal weakness, numbness and tingling. No recent trauma or falls.   Patients course is complicated by anxiety and depression.      Review of Systems  Musculoskeletal:  Positive for back pain and myalgias.  Neurological:  Negative for tingling, sensory change, focal weakness and weakness.  All other systems reviewed and are negative.  Otherwise per HPI.  Assessment & Plan: Visit Diagnoses:    ICD-10-CM   1. Chronic bilateral low back pain with bilateral sciatica  M54.42    M54.41    G89.29     2. Lumbar radiculopathy  M54.16     3. Myofascial pain syndrome  M79.18        Plan: Findings:  Chronic, worsening and severe bilateral lower back  pain radiating to buttocks, right greater than left.  Patient continues to have severe pain despite good conservative therapy such as formal physical therapy/dry needling, home exercise regimen, rest and use of medications.  Patient's clinical presentation and exam are complex, differentials include lumbar radiculopathy and myofascial pain syndrome.  Recent lumbar radiographs look fairly normal for her age.  Myofascial tenderness noted to bilateral lumbar paraspinal regions upon palpation today.  We discussed treatment plan in detail today.  Next step is to obtain lumbar MRI imaging.  We will have her follow-up to discuss lumbar MRI imaging and further treatments.  I would like her to continue with physical therapy and home exercise regimen.  She can also continue with ibuprofen and Flexeril as needed. No red flag symptoms noted upon exam today.     Meds & Orders: No orders of the defined types were placed in this encounter.  No orders of the defined types were placed in this encounter.   Follow-up: Return for Lumbar MRI imaging.   Procedures: No procedures performed      Clinical History: No specialty comments available.   She reports that she has never smoked. She has never used smokeless tobacco. No results for input(s): HGBA1C, LABURIC in the last 8760 hours.  Objective:  VS:  HT:    WT:   BMI:     BP:   HR: bpm  TEMP: ( )  RESP:  Physical Exam Vitals and nursing note reviewed.  HENT:     Head: Normocephalic and atraumatic.  Right Ear: External ear normal.     Left Ear: External ear normal.     Nose: Nose normal.     Mouth/Throat:     Mouth: Mucous membranes are moist.  Eyes:     Extraocular Movements: Extraocular movements intact.  Cardiovascular:     Rate and Rhythm: Normal rate.     Pulses: Normal pulses.  Pulmonary:     Effort: Pulmonary effort is normal.  Abdominal:     General: Abdomen is flat. There is no distension.  Musculoskeletal:        General:  Tenderness present.     Cervical back: Normal range of motion.     Comments: Patient rises from seated position to standing without difficulty. Good lumbar range of motion. No pain noted with facet loading. 5/5 strength noted with bilateral hip flexion, knee flexion/extension, ankle dorsiflexion/plantarflexion and EHL. No clonus noted bilaterally. No pain upon palpation of greater trochanters. No pain with internal/external rotation of bilateral hips. Sensation intact bilaterally. Myofascial tenderness noted to bilateral lumbar paraspinal regions. Negative slump test bilaterally. Ambulates without aid, gait steady.     Skin:    General: Skin is warm and dry.     Capillary Refill: Capillary refill takes less than 2 seconds.  Neurological:     General: No focal deficit present.     Mental Status: She is alert and oriented to person, place, and time.  Psychiatric:        Mood and Affect: Mood normal.        Behavior: Behavior normal.     Ortho Exam  Imaging: No results found.  Past Medical/Family/Surgical/Social History: Medications & Allergies reviewed per EMR, new medications updated. Patient Active Problem List   Diagnosis Date Noted   Encounter for insertion of Mirena  IUD 05/08/2024   Cervical cancer screening 05/08/2024   Routine screening for STI (sexually transmitted infection) 05/08/2024   Epidermal inclusion cyst 02/02/2022   Anxiety and depression 12/29/2021   Family history of high cholesterol 12/29/2021   Past Medical History:  Diagnosis Date   Anxiety    Lumbar spinal stenosis    Family History  Problem Relation Age of Onset   Hyperlipidemia Mother    Hypertension Father    Cancer Maternal Grandmother    Kidney disease Maternal Grandmother    Heart attack Maternal Grandfather    Hypertension Paternal Grandmother    Hypertension Paternal Grandfather    Alcohol abuse Paternal Grandfather    Past Surgical History:  Procedure Laterality Date   TONSILLECTOMY  Bilateral    WISDOM TOOTH EXTRACTION     Social History   Occupational History   Not on file  Tobacco Use   Smoking status: Never   Smokeless tobacco: Never  Vaping Use   Vaping status: Former  Substance and Sexual Activity   Alcohol use: No   Drug use: No   Sexual activity: Not Currently

## 2024-06-08 NOTE — Progress Notes (Signed)
 Pain Scale   Average Pain 5 Patient advised that she has lower back pain radiating to her right hip and leg.        +Driver, -BT, -Dye Allergies.

## 2024-06-11 ENCOUNTER — Ambulatory Visit (HOSPITAL_BASED_OUTPATIENT_CLINIC_OR_DEPARTMENT_OTHER): Admitting: Certified Nurse Midwife

## 2024-06-11 ENCOUNTER — Encounter (HOSPITAL_BASED_OUTPATIENT_CLINIC_OR_DEPARTMENT_OTHER): Payer: Self-pay | Admitting: Certified Nurse Midwife

## 2024-06-11 VITALS — BP 107/78 | HR 75 | Ht 64.0 in | Wt 189.0 lb

## 2024-06-11 DIAGNOSIS — Z30431 Encounter for routine checking of intrauterine contraceptive device: Secondary | ICD-10-CM

## 2024-06-11 NOTE — Progress Notes (Signed)
 Subjective:     Marajade Lei is a 23 y.o. female here for 1 month post IUD insertion follow-up. Pt has experienced only random spotting since IUD insertion. She is satisfied with Mirena  at this time.    The following portions of the patient's history were reviewed and updated as appropriate: allergies, current medications, past family history, past medical history, past social history, past surgical history, and problem list.   Review of Systems Pertinent items are noted in HPI.    Objective:    BP 107/78   Pulse 75   Ht 5' 4 (1.626 m) Comment: Reported  Wt 189 lb (85.7 kg)   LMP 05/20/2024   BMI 32.44 kg/m  General appearance: alert, cooperative, and appears stated age Pelvic: cervix normal in appearance, external genitalia normal, no adnexal masses or tenderness, no cervical motion tenderness, uterus normal size, shape, and consistency, vagina normal without discharge, and IUD strings visible and palpable    Assessment:    IUD surveillance.    Plan:    IUD string check completed Pap smear reviewed/ negative on 05/07/24. RTO 1 year for annual gyn exam and prn if issues arise. Pt will consider trying to feel for IUD strings monthly.  Arland MARLA Roller

## 2024-06-13 ENCOUNTER — Ambulatory Visit
Admission: RE | Admit: 2024-06-13 | Discharge: 2024-06-13 | Disposition: A | Discharge status: Home / Self Care | Source: Ambulatory Visit | Attending: Physical Medicine and Rehabilitation | Admitting: Physical Medicine and Rehabilitation

## 2024-06-13 DIAGNOSIS — M7918 Myalgia, other site: Secondary | ICD-10-CM

## 2024-06-13 DIAGNOSIS — M5416 Radiculopathy, lumbar region: Secondary | ICD-10-CM

## 2024-06-13 DIAGNOSIS — G8929 Other chronic pain: Secondary | ICD-10-CM

## 2024-06-19 ENCOUNTER — Telehealth: Payer: Self-pay | Admitting: Physical Medicine and Rehabilitation

## 2024-06-19 NOTE — Telephone Encounter (Signed)
 I called patient this afternoon to discuss recent lumbar MRI imaging, looks fairly normal for her age. No severe nerve impingement, no severe facet arthropathy. Would recommend she continue with PT/chiropractic treatments. We are happy to see her back as needed.

## 2024-06-19 NOTE — Telephone Encounter (Signed)
 Pt called stating she received a call she believe to go over MRI Results with Megan. Please call pt at [phone].

## 2024-09-10 ENCOUNTER — Encounter: Payer: Self-pay | Admitting: Radiology
